# Patient Record
Sex: Female | Born: 1965 | Race: White | Hispanic: No | Marital: Married | Smoking: Never smoker
Health system: Southern US, Community
[De-identification: ages and names within clinical notes are randomized; demographics above are authoritative.]

## PROBLEM LIST (undated history)

## (undated) DIAGNOSIS — C801 Malignant (primary) neoplasm, unspecified: Secondary | ICD-10-CM

## (undated) DIAGNOSIS — I10 Essential (primary) hypertension: Secondary | ICD-10-CM

---

## 2010-12-29 ENCOUNTER — Ambulatory Visit: Payer: Self-pay | Admitting: Internal Medicine

## 2013-12-30 LAB — HCG, QUANTITATIVE, PREGNANCY: Beta Hcg, Quant.: <1 m[IU]/mL — ABNORMAL LOW

## 2013-12-31 ENCOUNTER — Ambulatory Visit: Payer: Self-pay | Admitting: Orthopedic Surgery

## 2013-12-31 ENCOUNTER — Observation Stay: Payer: Self-pay | Admitting: Orthopedic Surgery
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
## 2016-02-24 ENCOUNTER — Other Ambulatory Visit: Payer: Self-pay | Admitting: Family Medicine

## 2016-02-24 DIAGNOSIS — Z1231 Encounter for screening mammogram for malignant neoplasm of breast: Secondary | ICD-10-CM

## 2016-03-29 ENCOUNTER — Ambulatory Visit
Admission: RE | Admit: 2016-03-29 | Discharge: 2016-03-29 | Disposition: A | Discharge status: Home / Self Care | Payer: No Typology Code available for payment source | Source: Ambulatory Visit | Attending: Family Medicine | Admitting: Family Medicine

## 2016-03-29 DIAGNOSIS — Z1231 Encounter for screening mammogram for malignant neoplasm of breast: Secondary | ICD-10-CM | POA: Diagnosis not present

## 2016-03-29 HISTORY — DX: Malignant (primary) neoplasm, unspecified: C80.1

## 2017-03-08 ENCOUNTER — Other Ambulatory Visit: Payer: Self-pay | Admitting: Family Medicine

## 2017-03-08 DIAGNOSIS — Z1239 Encounter for other screening for malignant neoplasm of breast: Secondary | ICD-10-CM

## 2021-01-20 ENCOUNTER — Other Ambulatory Visit: Payer: Self-pay

## 2021-01-20 ENCOUNTER — Encounter: Payer: Self-pay | Admitting: Emergency Medicine

## 2021-01-20 ENCOUNTER — Ambulatory Visit
Admission: EM | Admit: 2021-01-20 | Discharge: 2021-01-20 | Disposition: A | Discharge status: Home / Self Care | Payer: PRIVATE HEALTH INSURANCE | Attending: Sports Medicine | Admitting: Sports Medicine

## 2021-01-20 DIAGNOSIS — R21 Rash and other nonspecific skin eruption: Secondary | ICD-10-CM

## 2021-01-20 DIAGNOSIS — L299 Pruritus, unspecified: Secondary | ICD-10-CM

## 2021-01-20 DIAGNOSIS — L237 Allergic contact dermatitis due to plants, except food: Secondary | ICD-10-CM

## 2021-01-20 DIAGNOSIS — L2389 Allergic contact dermatitis due to other agents: Secondary | ICD-10-CM

## 2021-01-20 HISTORY — DX: Essential (primary) hypertension: I10

## 2021-01-20 MED ORDER — METHYLPREDNISOLONE SODIUM SUCC 125 MG IJ SOLR
125.0000 mg | Freq: Once | INTRAMUSCULAR | Status: AC
  Administered 2021-01-20: 125 mg via INTRAMUSCULAR

## 2021-01-20 MED ORDER — PREDNISONE 10 MG (21) PO TBPK: ORAL_TABLET | Freq: Every day | ORAL | 0 refills | Status: DC

## 2021-01-20 NOTE — ED Provider Notes (Signed)
[facility]    CSN: [id number] Arrival date & time: 01/20/21  1542      History   Chief Complaint Chief Complaint  Patient presents with  . Rash    HPI Ashley Frye is a 55 y.o. female.   Patient is a pleasant 55 year old female who presents for evaluation of the above issue.  She normally has her primary care needs met by a provider in [place].  They were unavailable today.  She works as a Art therapist.  She was weeding in the yard about 10 days ago and noticed a rash that began about a week ago.  Symptoms have been progressing and now involve both arms and both legs.  Her left leg seems to be the worst.  She is having itch that is worse at night.  She has been taking oral Benadryl.  Cannot take it during the day because it makes her sleepy.  She has been using calamine lotion.  She denies any fevers shakes chills.  No nausea vomiting diarrhea.  No chest pain, throat tightening, or any evidence of anaphylaxis.  No red flag signs or symptoms noted on history.  No shortness of breath.     Past Medical History:  Diagnosis Date  . Cancer ([place])    basal cell  . Hypertension     There are no problems to display for this patient.   History reviewed. No pertinent surgical history.  OB History   No obstetric history on file.      Home Medications    Prior to Admission medications   Medication Sig Start Date End Date Taking? Authorizing Provider  lisinopril (ZESTRIL) 5 MG tablet Take 1 tablet by mouth daily. 10/20/18  Yes [provider]  Multiple Vitamin (MULTIVITAMIN) tablet Take 1 tablet by mouth daily.   Yes [provider]  predniSONE (STERAPRED UNI-PAK 21 TAB) 10 MG (21) TBPK tablet Take by mouth daily. Take 6 tabs by mouth daily  for 2 days, then 5 tabs for 2 days, then 4 tabs for 2 days, then 3 tabs for 2 days, 2 tabs for 2 days, then 1 tab by mouth daily for 2 days 01/20/21  Yes Verda Cumins, MD    Family History Family History   Problem Relation Age of Onset  . Breast cancer Maternal Aunt     Social History Social History   Tobacco Use  . Smoking status: Never Smoker  . Smokeless tobacco: Never Used  Vaping Use  . Vaping Use: Never used  Substance Use Topics  . Alcohol use: Not Currently  . Drug use: Not Currently     Allergies   Patient has no known allergies.   Review of Systems Review of Systems  Constitutional: Negative for activity change, appetite change, chills, diaphoresis, fatigue and fever.  HENT: Negative.  Negative for congestion and sore throat.   Respiratory: Negative.  Negative for cough, chest tightness, shortness of breath, wheezing and stridor.   Cardiovascular: Negative.  Negative for chest pain and palpitations.  Gastrointestinal: Negative.  Negative for abdominal pain, constipation, diarrhea, nausea and vomiting.  Genitourinary: Negative.  Negative for dysuria.  Musculoskeletal: Negative.  Negative for arthralgias, back pain, joint swelling and myalgias.  Skin: Positive for color change and rash. Negative for pallor and wound.  Allergic/Immunologic: Negative.  Negative for environmental allergies, food allergies and immunocompromised state.  Neurological: Negative.  Negative for dizziness, syncope, weakness, light-headedness, numbness and headaches.  Hematological: Negative.  Negative for adenopathy. Does  not bruise/bleed easily.  All other systems reviewed and are negative.    Physical Exam Triage Vital Signs ED Triage Vitals  Enc Vitals Group     BP 01/20/21 1608 (!) 180/94     Pulse Rate 01/20/21 1608 77     Resp 01/20/21 1608 18     Temp 01/20/21 1608 98.2 F (36.8 C)     Temp Source 01/20/21 1608 Oral     SpO2 01/20/21 1608 99 %     Weight 01/20/21 1606 118 lb (53.5 kg)     Height 01/20/21 1606 _0  (1.499 m)     Head Circumference --      Peak Flow --      Pain Score 01/20/21 1606 0     Pain Loc --      Pain Edu? --      Excl. in [place]? --    No data  found.  Updated Vital Signs BP (!) 180/94 (BP Location: Right Arm)   Pulse 77   Temp 98.2 F (36.8 C) (Oral)   Resp 18   Ht _1  (1.499 m)   Wt 53.5 kg   LMP 01/06/2021 (Approximate)   SpO2 99%   BMI 23.83 kg/m   Visual Acuity Right Eye Distance:   Left Eye Distance:   Bilateral Distance:    Right Eye Near:   Left Eye Near:    Bilateral Near:     Physical Exam Vitals and nursing note reviewed.  Constitutional:      General: She is not in acute distress.    Appearance: Normal appearance. She is not ill-appearing, toxic-appearing or diaphoretic.  HENT:     Head: Normocephalic and atraumatic.     Nose: Nose normal. No congestion or rhinorrhea.     Mouth/Throat:     Mouth: Mucous membranes are moist.     Pharynx: No oropharyngeal exudate or posterior oropharyngeal erythema.  Eyes:     General: No scleral icterus.       Right eye: No discharge.        Left eye: No discharge.     Extraocular Movements: Extraocular movements intact.     Conjunctiva/sclera: Conjunctivae normal.     Pupils: Pupils are equal, round, and reactive to light.  Cardiovascular:     Rate and Rhythm: Normal rate and regular rhythm.     Pulses: Normal pulses.     Heart sounds: Normal heart sounds. No murmur heard. No friction rub. No gallop.   Pulmonary:     Effort: Pulmonary effort is normal. No respiratory distress.     Breath sounds: Normal breath sounds. No stridor. No wheezing, rhonchi or rales.  Musculoskeletal:     Cervical back: Normal range of motion and neck supple. No rigidity or tenderness.  Lymphadenopathy:     Cervical: No cervical adenopathy.  Skin:    General: Skin is warm.     Capillary Refill: Capillary refill takes less than 2 seconds.     Findings: Rash present. Rash is macular and papular. Rash is not crusting, nodular, purpuric, pustular, scaling or vesicular.     Comments: Diffuse maculopapular rash over arms and legs consistent with poison ivy or poison oak dermatitis.   There are excoriations due to the pruritus.  Neurological:     General: No focal deficit present.     Mental Status: She is alert and oriented to person, place, and time.      UC Treatments / Results  Labs (all labs  ordered are listed, but only abnormal results are displayed) Labs Reviewed - No data to display  EKG   Radiology No results found.  Procedures Procedures (including critical care time)  Medications Ordered in UC Medications  methylPREDNISolone sodium succinate (SOLU-MEDROL) 125 mg/2 mL injection 125 mg (125 mg Intramuscular Given 01/20/21 1657)    Initial Impression / Assessment and Plan / UC Course  I have reviewed the triage vital signs and the nursing notes.  Pertinent labs & imaging results that were available during my care of the patient were reviewed by me and considered in my medical decision making (see chart for details).   Clinical impression: Diffuse contact dermatitis consistent with poison ivy or poison oak dermatitis with associated pruritus.  Treatment plan: 1.  The findings and treatment plan were discussed in detail with the patient.  Patient was in agreement. 2.  I recommended giving her a methylprednisolone IM injection in the office.  One was ordered and dispensed.  She tolerated this without incident. 3.  Sent in a prescription for a prednisone taper.  She will start that tomorrow. 4.  Oral Benadryl at night due to the sedating properties.  She can use topical Benadryl during the day and continue with calamine lotion. 5.  Educational handouts provided. 6.  Offered a work note she deferred on that. 7.  Asked her to refrain from scratching the area so she does not create a superimposed infection. 8.  If symptoms persist she can see her primary care provider. 9.  Discharge from care at this time.  She was stable at discharge.  She will follow-up here as needed.    Final Clinical Impressions(s) / UC Diagnoses   Final diagnoses:  Poison  ivy dermatitis  Allergic contact dermatitis due to other agents  Rash and nonspecific skin eruption  Pruritus     Discharge Instructions     As we discussed you have a diffuse contact dermatitis probably secondary to poison ivy exposure. You received a steroid injection today. I gave you a prescription for steroids and you should start that tomorrow. You can use oral Benadryl at night for the itchiness. You can use topical Benadryl during the day for itchiness. Please refrain from scratching the area so you do not create a superimposed infection. Please see educational handouts. If your symptoms persist please see your primary care provider.    ED Prescriptions    Medication Sig Dispense Auth. Provider   predniSONE (STERAPRED UNI-PAK 21 TAB) 10 MG (21) TBPK tablet Take by mouth daily. Take 6 tabs by mouth daily  for 2 days, then 5 tabs for 2 days, then 4 tabs for 2 days, then 3 tabs for 2 days, 2 tabs for 2 days, then 1 tab by mouth daily for 2 days 42 tablet Verda Cumins, MD     PDMP not reviewed this encounter.   Verda Cumins, MD 01/23/21 [id number]

## 2021-01-20 NOTE — Discharge Instructions (Signed)
As we discussed you have a diffuse contact dermatitis probably secondary to poison ivy exposure. You received a steroid injection today. I gave you a prescription for steroids and you should start that tomorrow. You can use oral Benadryl at night for the itchiness. You can use topical Benadryl during the day for itchiness. Please refrain from scratching the area so you do not create a superimposed infection. Please see educational handouts. If your symptoms persist please see your primary care provider.

## 2021-01-20 NOTE — ED Triage Notes (Signed)
Pt c/o poison oak on bilateral arms, and legs. She states she continues to find new place. Started about 3 days ago.

## 2023-06-14 ENCOUNTER — Other Ambulatory Visit: Payer: Self-pay

## 2023-06-14 ENCOUNTER — Emergency Department: Payer: Federal, State, Local not specified - PPO

## 2023-06-14 ENCOUNTER — Emergency Department
Admission: EM | Admit: 2023-06-14 | Discharge: 2023-06-14 | Disposition: A | Discharge status: Home / Self Care | Payer: Federal, State, Local not specified - PPO | Attending: Emergency Medicine | Admitting: Emergency Medicine

## 2023-06-14 ENCOUNTER — Encounter: Payer: Self-pay | Admitting: Emergency Medicine

## 2023-06-14 DIAGNOSIS — I10 Essential (primary) hypertension: Secondary | ICD-10-CM | POA: Insufficient documentation

## 2023-06-14 DIAGNOSIS — R0602 Shortness of breath: Secondary | ICD-10-CM | POA: Diagnosis present

## 2023-06-14 DIAGNOSIS — J4551 Severe persistent asthma with (acute) exacerbation: Secondary | ICD-10-CM | POA: Insufficient documentation

## 2023-06-14 LAB — BASIC METABOLIC PANEL
Anion gap: 10 (ref 5–15)
BUN: 17 mg/dL (ref 6–20)
CO2: 25 mmol/L (ref 22–32)
Calcium: 9 mg/dL (ref 8.9–10.3)
Chloride: 102 mmol/L (ref 98–111)
Creatinine, Ser: 0.76 mg/dL (ref 0.44–1.00)
GFR, Estimated: >60 mL/min (ref >60)
Glucose, Bld: 120 mg/dL — ABNORMAL HIGH (ref 70–99)
Potassium: 3.9 mmol/L (ref 3.5–5.1)
Sodium: 137 mmol/L (ref 135–145)

## 2023-06-14 LAB — CBC WITH DIFFERENTIAL/PLATELET
Abs Immature Granulocytes: 0.02 10*3/uL (ref 0.00–0.07)
Basophils Absolute: 0.1 10*3/uL (ref 0.0–0.1)
Basophils Relative: 1 %
Eosinophils Absolute: 0.9 10*3/uL — ABNORMAL HIGH (ref 0.0–0.5)
Eosinophils Relative: 10 %
HCT: 42.5 % (ref 36.0–46.0)
Hemoglobin: 13.7 g/dL (ref 12.0–15.0)
Immature Granulocytes: 0 %
Lymphocytes Relative: 24 %
Lymphs Abs: 2.1 10*3/uL (ref 0.7–4.0)
MCH: 31.2 pg (ref 26.0–34.0)
MCHC: 32.2 g/dL (ref 30.0–36.0)
MCV: 96.8 fL (ref 80.0–100.0)
Monocytes Absolute: 0.6 10*3/uL (ref 0.1–1.0)
Monocytes Relative: 6 %
Neutro Abs: 5.2 10*3/uL (ref 1.7–7.7)
Neutrophils Relative %: 59 %
Platelets: 248 10*3/uL (ref 150–400)
RBC: 4.39 MIL/uL (ref 3.87–5.11)
RDW: 14.5 % (ref 11.5–15.5)
WBC: 8.8 10*3/uL (ref 4.0–10.5)
nRBC: 0 % (ref 0.0–0.2)

## 2023-06-14 LAB — TROPONIN I (HIGH SENSITIVITY): Troponin I (High Sensitivity): 4 ng/L (ref <18)

## 2023-06-14 MED ORDER — METHYLPREDNISOLONE SODIUM SUCC 125 MG IJ SOLR
125.0000 mg | Freq: Once | INTRAMUSCULAR | Status: AC
  Administered 2023-06-14: 125 mg via INTRAVENOUS
  Filled 2023-06-14: qty 2

## 2023-06-14 MED ORDER — IPRATROPIUM-ALBUTEROL 0.5-2.5 (3) MG/3ML IN SOLN
RESPIRATORY_TRACT | Status: AC
  Administered 2023-06-14: 9 mL via RESPIRATORY_TRACT
  Filled 2023-06-14: qty 9

## 2023-06-14 MED ORDER — PREDNISONE 20 MG PO TABS: 60.0000 mg | ORAL_TABLET | Freq: Every day | ORAL | 0 refills | Status: AC

## 2023-06-14 MED ORDER — ALBUTEROL SULFATE HFA 108 (90 BASE) MCG/ACT IN AERS: 2.0000 | INHALATION_SPRAY | Freq: Four times a day (QID) | RESPIRATORY_TRACT | 1 refills | Status: AC | PRN

## 2023-06-14 MED ORDER — IPRATROPIUM-ALBUTEROL 0.5-2.5 (3) MG/3ML IN SOLN: 9.0000 mL | Freq: Once | RESPIRATORY_TRACT | Status: AC

## 2023-06-14 NOTE — ED Provider Notes (Signed)
[facility] Provider Note    Event Date/Time   First MD Initiated Contact with Patient 06/14/23 [phone]     (approximate)   History   Chief Complaint Asthma   HPI  Jaqulyn DEXTER RZEPECKI is a 57 y.o. female with past medical history of hypertension and asthma who presents to the ED complaining of shortness of breath.  Patient reports that she has been having increasing difficulty breathing over the course of the day today with a dry cough.  She denies any associated fevers does report significant tightness in her chest, but denies pain or swelling in her legs.  Symptoms are similar to prior asthma attacks, she has been using her albuterol inhaler at home with partial relief, but has since run out.      Physical Exam   Triage Vital Signs: ED Triage Vitals  Encounter Vitals Group     BP --      Systolic BP Percentile --      Diastolic BP Percentile --      Pulse --      Resp --      Temp --      Temp src --      SpO2 --      Weight 06/14/23 0348 110 lb (49.9 kg)     Height 06/14/23 0348 4\' 10"  (1.473 m)     Head Circumference --      Peak Flow --      Pain Score 06/14/23 0345 0     Pain Loc --      Pain Education --      Exclude from Growth Chart --     Most recent vital signs: Vitals:   06/14/23 0430 06/14/23 0500  BP: (!) 146/84 135/73  Pulse: 96 97  Resp: 16 15  Temp:    SpO2: 96% 96%    Constitutional: Alert and oriented. Eyes: Conjunctivae are normal. Head: Atraumatic. Nose: No congestion/rhinnorhea. Mouth/Throat: Mucous membranes are moist.  Cardiovascular: Normal rate, regular rhythm. Grossly normal heart sounds.  2+ radial pulses bilaterally. Respiratory: Tachypneic with increased work of breathing, poor air movement throughout with inspiratory and expiratory wheezing. Gastrointestinal: Soft and nontender. No distention. Musculoskeletal: No lower extremity tenderness nor edema.  Neurologic:  Normal speech and language. No gross focal  neurologic deficits are appreciated.    ED Results / Procedures / Treatments   Labs (all labs ordered are listed, but only abnormal results are displayed) Labs Reviewed  CBC WITH DIFFERENTIAL/PLATELET - Abnormal; Notable for the following components:      Result Value   Eosinophils Absolute 0.9 (*)    All other components within normal limits  BASIC METABOLIC PANEL - Abnormal; Notable for the following components:   Glucose, Bld 120 (*)    All other components within normal limits  TROPONIN I (HIGH SENSITIVITY)     EKG  ED ECG REPORT I, Chesley Noon, the attending physician, personally viewed and interpreted this ECG.   Date: 06/14/2023  EKG Time: 3:56  Rate: 87  Rhythm: normal sinus rhythm  Axis: Normal  Intervals:none  ST&T Change: None  RADIOLOGY Chest x-ray reviewed and interpreted by me with no infiltrate, edema, or effusion.  PROCEDURES:  Critical Care performed: No  Procedures   MEDICATIONS ORDERED IN ED: Medications  ipratropium-albuterol (DUONEB) 0.5-2.5 (3) MG/3ML nebulizer solution 9 mL (9 mLs Nebulization Given 06/14/23 0357)  methylPREDNISolone sodium succinate (SOLU-MEDROL) 125 mg/2 mL injection 125 mg (125 mg Intravenous Given 06/14/23 0359)  IMPRESSION / MDM / ASSESSMENT AND PLAN / ED COURSE  I reviewed the triage vital signs and the nursing notes.                              57 y.o. female with past medical history of hypertension and asthma who presents to the ED with increasing difficulty breathing, cough, and chest tightness over the past 24 hours.  Patient's presentation is most consistent with acute presentation with potential threat to life or bodily function.  Differential diagnosis includes, but is not limited to, asthma exacerbation, ACS, PE, CHF, pneumonia, pneumothorax.  Patient ill-appearing and in moderate respiratory distress, increased work of breathing noted with poor air movement and wheezing throughout.  She is  currently maintaining oxygen saturations on room air, we will treat with DuoNebs and IV Solu-Medrol, low threshold to transition to BiPAP if she does not improve following breathing treatments.  EKG shows no evidence of arrhythmia or ischemia, chest x-ray and labs are pending at this time.  Patient's work of breathing much improved following DuoNebs and IV Solu-Medrol.  Chest x-ray is unremarkable and labs are reassuring, troponin within normal limits.  No significant anemia, leukocytosis, lecture abnormality, or AKI noted.  On reassessment, wheezing has almost entirely resolved and patient moving much more air than before.  She states her breathing feels almost back to normal and she is now speaking in full sentences.  She is appropriate for outpatient management, was counseled to follow-up with her PCP or pulmonologist and to return to the ED for new or worsening symptoms.  Patient agrees with plan.      FINAL CLINICAL IMPRESSION(S) / ED DIAGNOSES   Final diagnoses:  Severe persistent asthma with exacerbation     Rx / DC Orders   ED Discharge Orders          Ordered    predniSONE (DELTASONE) 20 MG tablet  Daily with breakfast        06/14/23 0547    albuterol (VENTOLIN HFA) 108 (90 Base) MCG/ACT inhaler  Every 6 hours PRN       Note to Pharmacy: Please supply with spacer   06/14/23 0547             Note:  This document was prepared using Dragon voice recognition software and may include unintentional dictation errors.   Chesley Noon, MD 06/14/23 [phone]

## 2023-06-14 NOTE — ED Notes (Signed)
Pt's husband was instructed to not silence the alarms on the monitor, pt's sats had dipped to 88-89%, RA was unaware d/t husband silencing the alarms as it "is annoying". Educated pt and husband of monitor alarms and their importance, if silence before any medical staff can review may miss critical changes in pt's status. Both verbalized understanding. Upon talking to pt, sats increased back to 94-96 % on RA. Pt reports is feeling better, is able to talk in complete sentences at this time

## 2023-06-14 NOTE — ED Triage Notes (Signed)
Patient ambulatory to triage with steady gait, without difficulty or distress noted; pt reports [facility]; denies any recent illness, st having asthma attack unrelieved by her albuterol inhaler; unable to speak in complete sentences

## 2024-07-23 ENCOUNTER — Other Ambulatory Visit: Payer: Self-pay | Admitting: Family Medicine

## 2024-07-23 DIAGNOSIS — Z1231 Encounter for screening mammogram for malignant neoplasm of breast: Secondary | ICD-10-CM
